# Patient Record
Sex: Male | Born: 1955 | Race: White | Hispanic: No | Marital: Married | Smoking: Current every day smoker
Health system: Southern US, Community
[De-identification: ages and names within clinical notes are randomized; demographics above are authoritative.]

## PROBLEM LIST (undated history)

## (undated) DIAGNOSIS — I10 Essential (primary) hypertension: Secondary | ICD-10-CM

## (undated) DIAGNOSIS — E079 Disorder of thyroid, unspecified: Secondary | ICD-10-CM

## (undated) HISTORY — DX: Disorder of thyroid, unspecified: E07.9

## (undated) HISTORY — DX: Essential (primary) hypertension: I10

---

## 1999-05-15 ENCOUNTER — Emergency Department (HOSPITAL_COMMUNITY): Admission: EM | Admit: 1999-05-15 | Discharge: 1999-05-15 | Payer: Self-pay | Admitting: Internal Medicine

## 2007-10-11 ENCOUNTER — Inpatient Hospital Stay (HOSPITAL_COMMUNITY): Admission: EM | Admit: 2007-10-11 | Discharge: 2007-10-11 | Payer: Self-pay | Admitting: Emergency Medicine

## 2007-10-11 ENCOUNTER — Ambulatory Visit: Payer: Self-pay | Admitting: Cardiovascular Disease

## 2007-10-11 ENCOUNTER — Encounter: Payer: Self-pay | Admitting: Internal Medicine

## 2008-03-26 ENCOUNTER — Ambulatory Visit: Payer: Self-pay | Admitting: Cardiology

## 2008-03-26 ENCOUNTER — Ambulatory Visit: Payer: Self-pay

## 2008-03-26 LAB — CONVERTED CEMR LAB: T3, Free: 18.2 pg/mL — ABNORMAL HIGH (ref 2.3–4.2)

## 2008-03-31 ENCOUNTER — Encounter: Payer: Self-pay | Admitting: Cardiology

## 2008-03-31 ENCOUNTER — Ambulatory Visit: Payer: Self-pay

## 2008-03-31 ENCOUNTER — Ambulatory Visit: Payer: Self-pay | Admitting: Cardiology

## 2008-04-08 ENCOUNTER — Ambulatory Visit: Payer: Self-pay | Admitting: Cardiology

## 2008-04-08 LAB — CONVERTED CEMR LAB: Prothrombin Time: 104.7 s (ref 10.9–13.3)

## 2008-04-09 ENCOUNTER — Encounter: Payer: Self-pay | Admitting: Endocrinology

## 2008-04-09 ENCOUNTER — Ambulatory Visit: Payer: Self-pay | Admitting: Cardiology

## 2008-04-09 DIAGNOSIS — E0590 Thyrotoxicosis, unspecified without thyrotoxic crisis or storm: Secondary | ICD-10-CM | POA: Insufficient documentation

## 2008-04-13 ENCOUNTER — Ambulatory Visit: Payer: Self-pay

## 2008-04-20 ENCOUNTER — Ambulatory Visit: Payer: Self-pay | Admitting: Cardiology

## 2008-04-20 LAB — CONVERTED CEMR LAB
INR: 1.3 — ABNORMAL HIGH (ref 0.8–1.0)
Prothrombin Time: 15.3 s — ABNORMAL HIGH (ref 10.9–13.3)

## 2008-04-27 ENCOUNTER — Ambulatory Visit: Payer: Self-pay | Admitting: Cardiology

## 2008-04-29 ENCOUNTER — Encounter (HOSPITAL_COMMUNITY): Admission: RE | Admit: 2008-04-29 | Discharge: 2008-07-14 | Payer: Self-pay | Admitting: Endocrinology

## 2008-04-30 ENCOUNTER — Encounter: Payer: Self-pay | Admitting: Endocrinology

## 2008-05-04 ENCOUNTER — Ambulatory Visit: Payer: Self-pay | Admitting: Cardiovascular Disease

## 2008-05-11 ENCOUNTER — Ambulatory Visit: Payer: Self-pay | Admitting: Cardiology

## 2008-05-11 LAB — CONVERTED CEMR LAB: Prothrombin Time: 24.1 s — ABNORMAL HIGH (ref 10.9–13.3)

## 2008-05-28 ENCOUNTER — Ambulatory Visit: Payer: Self-pay | Admitting: Cardiology

## 2008-06-14 ENCOUNTER — Ambulatory Visit: Payer: Self-pay | Admitting: Cardiology

## 2008-06-14 LAB — CONVERTED CEMR LAB
INR: 3.3 — ABNORMAL HIGH (ref 0.8–1.0)
Prothrombin Time: 33.8 s — ABNORMAL HIGH (ref 10.9–13.3)

## 2008-07-02 ENCOUNTER — Ambulatory Visit: Payer: Self-pay | Admitting: Internal Medicine

## 2008-07-02 LAB — CONVERTED CEMR LAB
INR: 1.5 — ABNORMAL HIGH (ref 0.8–1.0)
Prothrombin Time: 16.7 s — ABNORMAL HIGH (ref 10.9–13.3)

## 2008-07-21 ENCOUNTER — Ambulatory Visit: Payer: Self-pay | Admitting: Cardiology

## 2008-07-21 LAB — CONVERTED CEMR LAB
INR: 1.3 — ABNORMAL HIGH (ref 0.8–1.0)
Prothrombin Time: 14.9 s — ABNORMAL HIGH (ref 10.9–13.3)

## 2008-07-30 ENCOUNTER — Ambulatory Visit: Payer: Self-pay | Admitting: Cardiology

## 2008-07-30 LAB — CONVERTED CEMR LAB
INR: 1.3 — ABNORMAL HIGH (ref 0.8–1.0)
Prothrombin Time: 15.5 s — ABNORMAL HIGH (ref 10.9–13.3)

## 2008-08-10 ENCOUNTER — Ambulatory Visit: Payer: Self-pay

## 2008-08-27 ENCOUNTER — Ambulatory Visit: Payer: Self-pay | Admitting: Cardiology

## 2009-04-03 IMAGING — NM NM RAI THERAPY FOR HYPERTHYROIDISM
1 series · 1 of 1 positions shown · non-contrast
Comparison: Uptake and scan study done the same date.

CLINICAL DATA: Hyperthyroidism.

NUCLEAR MEDICINE RADIOACTIVE IODINE THERAPY FOR HYPERTHYROIDISM
TECHNIQUE: The risks and benefits of radioactive iodine therapy
were discussed with the patient in detail. Alternative therapies
were also mentioned. Radiation safety was discussed with the
patient, including how to protect the general public from exposure.
There were no barriers to communication.  Written consent was
obtained.  The patient then received a capsule containing the
radiopharmaceutical.  The patient will follow-up with the referring
physician.
Radiopharmaceutical: 30.5 mCi iodine 131 orally.

[Series 1: st static image · 1.61mm/px · 1 of 1 slices shown]
[im 1/1]
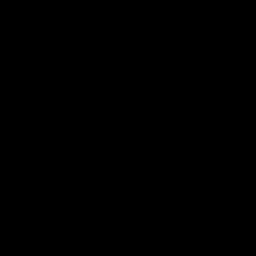

[1 of 1 positions shown; findings below may reference images not displayed]

IMPRESSION: Radioactive iodine therapy for hyperthyroidism as described.

## 2010-12-03 ENCOUNTER — Encounter: Payer: Self-pay | Admitting: Endocrinology

## 2011-03-27 NOTE — Discharge Summary (Signed)
NAME:  Chad Barron, Chad Barron              ACCOUNT NO.:  [id number]   MEDICAL RECORD NO.:  [id number]          PATIENT TYPE:  INP   LOCATION:  3735                         FACILITY:  [facility]   PHYSICIAN:  Madolyn Frieze. Jens Som, MD, FACCDATE OF BIRTH:  06-15-1956   DATE OF ADMISSION:  10/10/2007  DATE OF DISCHARGE:  10/11/2007                               DISCHARGE SUMMARY   PROCEDURES:  None.   PRIMARY FINAL DISCHARGE DIAGNOSIS:  Paroxysmal atrial fibrillation with  rapid ventricular response.   SECONDARY DIAGNOSES:  1. Hypertension.  2. Ongoing tobacco use.  3. History of alcohol use.  4. Family history of coronary artery disease.  5. Chest pain, felt secondary to atrial fibrillation.   TIME OF DISCHARGE:  Thirty-three minutes.   HOSPITAL COURSE:  Chad Barron is a 55 year old male with a history of  hypertension but no history of coronary artery disease.  He had onset of  palpitations that were associated with chest pain.  He came to the  emergency room,  where he was in atrial fibrillation with rapid  ventricular response and a heart rate of 154.  He was admitted for  further evaluation.   His cardiac enzymes were negative for MI.  A total cholesterol was 135,  triglycerides 214, HDL 22, LDL 70.  TSH was actually low at 0.011.  An  attempt will be made to add a free T3 and T4 to his labs.  A urine drug  screen was negative.   Chad Barron had a chest x-ray that showed mild peribronchial thickening  but no pneumonia.  He spontaneously converted to sinus rhythm and the  Cardizem was changed from IV to p.o.   He had come in shortly after midnight on October 11, 2007.  At  approximately noon on October 11, 2007, he was evaluated by Dr.  Jens Som, who felt that with his symptoms resolved he could be safely  discharged on aspirin and no Coumadin since his [place] score is 1 and  Cardizem.  He had not been taking lisinopril prior to admission because  it made him nauseated, and this and the  fluid pill he was taking are  currently on hold.  He will be followed in the office with an  echocardiogram, a stress Myoview and a followup with Dr. Jens Som.  Mr.  Barron was considered stable for discharge on October 11, 2007.   DISCHARGE INSTRUCTIONS:  His activity level is to be increased  gradually.  He is to stick to a low-sodium heart-healthy diet.  He is to  follow up in our office and we will call him for appointments.  He is to  follow up with primary care on [address] [facility] as needed.   DISCHARGE MEDICATIONS:  1. Cardizem CD 180 mg daily.  2. Aspirin 325 mg daily.   Lisinopril and previous diuretic are on hold.   He is encouraged not to smoke and to limit alcohol consumption.      Theodore Demark, PA-C      Madolyn Frieze. Jens Som, MD, [facility]  Electronically Signed    RB/MEDQ  D:  10/11/2007  T:  10/11/2007  Job:  [id number]   cc:   Derenda Mis

## 2011-03-27 NOTE — Assessment & Plan Note (Signed)
[facility]                            CARDIOLOGY OFFICE NOTE   ROBERTA, KELLY                     MRN:          [id number]  DATE:05/28/2008                            DOB:          [birth year]    Mr. Chad Barron is a 55 year old gentleman who I have seen for atrial  fibrillation.  When I last saw him on Mar 26, 2008, we scheduled him to  have an echocardiogram which was performed on Mar 31, 2008.  His LV  function was normal and the aortic valve was mildly calcified.  A repeat  thyroid panel revealed a TSH that was low at 0.02 and a free T3 that was  elevated at 18.2 with a free T4 of 4.6.  The patient was referred to Dr.  Talmage Barron, and he apparently has undergone radioactive treatment of his  thyroid successfully and he feels better at this point.  He denies any  dyspnea, chest pain, palpitations, or syncope.  There is no pedal edema.  His jittery/anxious feeling has improved.  He has now gained 5 pounds in  the past month, whereas he had lost 60-70 pounds in the preceding 6  months.   PRESENT MEDICATIONS:  Lopressor 50 mg p.o. b.i.d. and Coumadin as  directed.   PHYSICAL EXAMINATION:  VITAL SIGNS:  Blood pressure of 150/80 and pulse  is 80.  He weighs 223 pounds.  HEENT:  Normal.  NECK:  Supple.  CHEST:  Clear.  CARDIOVASCULAR:  Regular rate and rhythm.  ABDOMEN:  No tenderness.  EXTREMITIES:  No edema.   DIAGNOSES:  1. Atrial fibrillation - Mr. Ransome is in sinus rhythm on examination      today.  I think his atrial fibrillation was most likely caused by      his hyperthyroidism.  This is now being treated by Dr. Talmage Barron.  For      now, I will continue his Lopressor 50 mg p.o. b.i.d. and he will      continue on Coumadin with a goal INR of 2-3.  I will see him back      in 3 months.  If his thyroid has been fully treated, then we will      most likely discontinue his Coumadin at that point and resume his      aspirin.  Note, his LV function  is normal and his CHADS2 score is 1      for hypertension.  We are anticoagulating predominantly because the      hyperthyroidism is the cause of his atrial fibrillation.  Note, I      would also like to proceed with a Myoview after his thyroid is      completely treated.  2. Hypothyroidism - management per Dr. Talmage Barron.  3. Tobacco abuse - I discussed the importance of discontinuing this.  4. Hypertension - blood pressure is adequately controlled.      Chad Barron Chad Som, MD, [facility]  Electronically Signed   BSC/MedQ  DD: 05/28/2008  DT: 05/29/2008  Job #: [id number]

## 2011-03-27 NOTE — H&P (Signed)
NAME:  Chad Barron, Chad Barron NO.:  [id number]   MEDICAL RECORD NO.:  [id number]          PATIENT TYPE:  EMS   LOCATION:  MAJO                         FACILITY:  [facility]   PHYSICIAN:  Christell Faith, MD   DATE OF BIRTH:  20-Jul-1956   DATE OF ADMISSION:  10/10/2007  DATE OF DISCHARGE:                              HISTORY & PHYSICAL   PRIMARY CARE PHYSICIAN:  [facility] on [address].   CHIEF COMPLAINT:  Racing heart.   HISTORY OF PRESENT ILLNESS:  This is a 55 year old white man with no  prior cardiac history who was working in his yard this afternoon and  felt his heart suddenly start racing and beating irregularly.  This  lasted all day and so he presented to the emergency department.  This  has occurred before, but not for this duration.  In the past few months,  he has had several episodes of fast, irregular heart beat, but only  lasting minutes at a time.  He denies chest pain, syncope, presyncope or  shortness of breath.  He has no history of stroke or TIA or congestive  heart failure.   PAST MEDICAL HISTORY:  1. Hypertension.  2. History of a deep venous thrombosis approximately 20 years ago.  He      took Coumadin for 6 months.  This was in the left leg.  3. History of tonsillectomy.   ALLERGIES:  NONE.   MEDICINES:  Lisinopril and an unknown fluid pill.  Also one-a-day men's  vitamin.   SOCIAL HISTORY:  He lives in [place] with his wife and step-daughter.  He smokes 1-1/2 packs of cigarettes a day.  Drinks alcohol daily.  He is  a heavy caffeine user.  He drives a truck.  Adamantly denies cocaine,  amphetamine or pep-pill use.   FAMILY HISTORY:  Father is alive and had CABG at age 72.  Mother is  alive and has a pacemaker.   REVIEW OF SYSTEMS:  Positive for palpitations, weakness and arthralgias.  Otherwise, the balance of 14 systems is reviewed and is negative.   PHYSICAL EXAMINATION:  VITAL SIGNS:  Temperature 97.5.  Pulse 146.  Respiratory  rate 14.  Initial blood pressure 161/84.  Saturation 96% on  room air.  IN GENERAL:  This is a pleasant, healthy-looking white man in no acute  distress.  HEENT:  Pupils are equal, round and reactive to light.  Sclerae are  clear.  NECK:  Supple.  Neck veins are flat.  No carotid bruits.  No  thyromegaly.  No cervical lymphadenopathy.  CARDIAC EXAM:  Tachycardic rate.  Irregular rhythm.  No murmurs or  gallops.  Radial pulses are 2+ bilaterally.  Dorsalis pedis pulses 2+  bilaterally.  LUNGS:  Clear to auscultation bilaterally without wheezing or rales.  ABDOMEN:  Soft, nontender, nondistended with normal bowel sounds.  EXTREMITIES:  Reveal no clubbing, cyanosis or edema.  No lesions or  petechiae.  MUSCULOSKELETAL EXAM:  No joint effusions or joint deformities.  NEUROLOGIC EXAM:  Awake, alert and oriented x3.  There is 5/5 strength  in al 4 extremities.  DIAGNOSTIC TEST:  Chest x-ray reveals mild peribronchial thickening, but  no acute disease.  Electrocardiogram reveals atrial fibrillation with a  rate of 154 beats per minute.   LABORATORY DATA:  White blood cell 9.8, hemoglobin 15.3, platelets 247.  Sodium 137, potassium 3.5, BUN 8, creatinine 0.6, glucose 105.  CK-MB  1.2, troponin-I less than 0.05.   IMPRESSION:  A 55 year old white man with new onset atrial fibrillation  with rapid ventricular response.   PLAN:  1. Admit to telemetry.  2. Once rate controlled, convert to p.o. Diltiazem (the patient is      currently on Diltiazem drip in the emergency department).  3. Initiate heparin drip in anticipation of possible direct current      cardioversion.  4. Keep the patient n.p.o. after midnight for possible cardioversion.  5. Check TSH.  6. Check transthoracic echocardiogram.  7. Check D-dimer given history of deep venous thrombosis.  8. He will need tobacco cessation counseling.  9. We will counsel the patient to cut back on alcohol and caffeine      use.       Christell Faith, MD  Electronically Signed     NDL/MEDQ  D:  10/11/2007  T:  10/11/2007  Job:  [id number]

## 2011-03-27 NOTE — Assessment & Plan Note (Signed)
[facility]                            CARDIOLOGY OFFICE NOTE   Chad, Barron                     MRN:          [id number]  DATE:03/26/2008                            DOB:          [birth year]    Chad Barron is a 55 year old gentleman who returns for followup today.  Cardiac history dates back to October 10, 2007.  He had the sudden  onset of palpitations and was admitted to [facility] with  atrial fibrillation with rapid ventricle response.  He ruled out for  myocardial infarction with serial enzymes.  He converted spontaneously  to sinus rhythm and was placed on p.o. Cardizem.  At that time his Chads-  2 score was felt to be 1 for hypertension.  He was, therefore,  discharged on aspirin and Cardizem.  Note, apparently at the time of his  discharge his TSH was low at 0.11.  Followup T3 apparently was elevated  to 15.2 and T4 at 4.96.  Note the patient apparently did not have an  echocardiogram when he was in the hospital.  He was discharged on  aspirin and Cardizem 180 mg p.o. daily and was apparently lost to  followup until now.  Since that time he has had no dyspnea on exertion,  orthopnea, PND, pedal edema, presyncope, syncope, or chest pain.  He  denies any heat intolerance.  He has felt somewhat jittery  and  anxious.  He has lost approximately 60 pounds over the past 5 months.  He occasionally feels his heart skip.   MEDICATIONS:  His present medications include:  1. Cardizem at 180 mg daily.  2. Aspirin 325 mg daily.   PHYSICAL EXAMINATION:  VITAL SIGNS:  Blood pressure of 150/86, his pulse  is 74.  Weighs 227 pounds.  HEENT:  Normal.  NECK:  Supple.  I cannot appreciate thyromegaly.  CHEST:  Clear.  CARDIOVASCULAR:  Irregular rhythm.  Cannot appreciate murmurs, rubs or  gallops.  ABDOMEN:  Bowel exam shows no tenderness.  EXTREMITIES:  No edema.  The patient is somewhat tremulous on  examination.   His  electrocardiogram shows atrial fibrillation at a rate of 116.  There  are no significant ST changes.   DIAGNOSES:  1. Atrial fibrillation.  Chad Barron is back in atrial fibrillation      today.  However, he is relatively asymptomatic with no dyspnea or      chest pain.  It appears that his TSH was low and his T3-T4 elevated      at the time of his evaluation previously.  Therefore,      hyperthyroidism could certainly be causing his atrial fibrillation.      We will discontinue his Cardizem and instead control his heart rate      with Lopressor 50 mg p.o. b.i.d.  We can increase this as needed.      Also I am concerned that his risk of an embolic event is higher      since the etiology of his atrial fibrillation may be      hyperthyroidism.  To that end we  will discontinue his aspirin and      begin Coumadin with a goal INR of 2 to 3.  He will follow-up in our      Coumadin Clinic on Wednesday of next week for an INR evaluation.  I      have explained the importance of compliance with this and he is      agreeable.  We will also schedule him to have an echocardiogram to      assess his left ventricular function.  We will also refer him to      have his hyperthyroidism treated by Dr. Everardo All.  They will contact      him and this will be early next week that he will have that      evaluation.  Once his hyperthyroidism is treated and we may be able      to discontinue his Coumadin since that may be causing his atrial      fibrillation.  Also he will also need a Myoview for risk      stratification as he does have a family history of coronary      disease.  2. Hyperthyroidism.  I did speak with Dr. Jonny Ruiz who stated that the      primary care's office will contact Chad Barron and arrange for his      evaluation by Dr. Everardo All early next week.  3. Tobacco abuse.  We discussed importance of discontinuing this.  4. Hypertension.  We are adjusting Chad Barron blood pressure regimen      as  indicated.  I will change or add medications as indicated.  5.      History of alcohol use.  He has been decreased this from 12 liters      per day to approximately 4 to 5 beers per week.   Note we are also planning to repeat his TSH, T3 and T4 today prior to  his evaluation with Dr. Everardo All.  I will arrange for him to come back in  see [place] in approximately 4 weeks.  Note Chad Barron was very concerned  about the number of visits back to the office and also  financial issues  associated with that.  I explained that this is a potentially life  threatening disease and the risk of stroke if he does not take Coumadin.  He is agreeable to this.  Hopefully, this will be only be a temporary  thing once his hyperthyroidism is treated and his atrial fibrillation  may resolve.     Madolyn Frieze Jens Som, MD, [facility]  Electronically Signed    BSC/MedQ  DD: 03/26/2008  DT: 03/26/2008  Job #: [id number]   cc:   Gregary Signs A. Everardo All, MD

## 2011-03-27 NOTE — Assessment & Plan Note (Signed)
[facility]                            CARDIOLOGY OFFICE NOTE   Chad Barron, Chad Barron                     MRN:          [id number]  DATE:08/27/2008                            DOB:          May 03, 1956    Chad Barron is a pleasant gentleman who I have followed for atrial  fibrillation.  Note, an echocardiogram in May showed normal LV function.  His atrial fibrillation is felt secondary to hyperthyroidism.  Since I  last saw him, he has undergone radioactive treatment of his thyroid  successfully and he continues to improve.  This is being managed by Dr.  Talmage Barron.  Note, he is having some congestion and cough, but he has not had  chest pain, dyspnea, or palpitations.  There is no pedal edema or  syncope.   MEDICATIONS:  Include.  1. Levofloxacin 100 mcg p.o. daily.  2. Lopressor 50 mg p.o. daily.  3. Coumadin as directed.   PHYSICAL EXAMINATION:  VITAL SIGNS:  Today, shows a blood pressure  132/80, and his pulse is 66.  He weighs 257 pounds.  HEENT:  Normal.  NECK:  Supple.  CHEST:  Clear.  CARDIOVASCULAR:  Regular rate.  ABDOMEN:  No tenderness.  EXTREMITIES:  No edema.   DIAGNOSTIC DATA:  His electrocardiogram shows a sinus rhythm at a rate  of 66.  The axis is normal.  There are no ST changes noted.   DIAGNOSES:  1. Atrial fibrillation - I think all of Chad Barron rhythm problems      are related to his overactive thyroid.  This is now been treated      and he remains in sinus rhythm today.  His symptoms of      hyperthyroidism have also resolved.  We will discontinue his      Coumadin and I have asked him to take aspirin 81 mg daily.  He is      also on Lopressor 50 mg p.o. daily and I do not think he will      require this any longer.  I have asked him to decrease this to 25      mg p.o. daily for 3 days and then discontinue.  Note, his previous      LV function has been normal.  2. Hyperthyroidism - this will be managed by Dr. Talmage Barron.  3.  Tobacco abuse - we discussed to discontinued this.  4. History of hypertension - his blood pressure is well-controlled at      present.  He will monitor this off of Lopressor.  We will add other      medications as indicated.   I do think Chad Barron needs a treadmill and I have instructed him to  call us when he would like to proceed (at present he states he does not  have insurance and would like to delay this for now  and I think this is reasonable).  We will not schedule him to come back,  but he will contact us for his treadmill.     Chad Barron Chad Som, MD, [facility]  Electronically Signed  BSC/MedQ  DD: 08/27/2008  DT: 08/28/2008  Job #: [id number]   cc:   Chad Barron, M.D.

## 2011-08-21 LAB — POCT CARDIAC MARKERS
CKMB, poc: 1.2
Myoglobin, poc: 115
Troponin i, poc: <0.05

## 2011-08-21 LAB — T3, FREE: T3, Free: 15.2 — ABNORMAL HIGH (ref 2.3–4.2)

## 2011-08-21 LAB — CBC
HCT: 44.3
MCHC: 34.5
MCV: 84.3
Platelets: 247
RBC: 5.26
RDW: 12.6

## 2011-08-21 LAB — D-DIMER, QUANTITATIVE: D-Dimer, Quant: 0.27

## 2011-08-21 LAB — BASIC METABOLIC PANEL
BUN: 8
GFR calc non Af Amer: >60
Glucose, Bld: 105 — ABNORMAL HIGH

## 2011-08-21 LAB — PROTIME-INR
INR: 1
Prothrombin Time: 13.6

## 2011-08-21 LAB — APTT: aPTT: 31

## 2011-08-21 LAB — DIFFERENTIAL
Basophils Absolute: 0
Basophils Relative: 0
Eosinophils Absolute: 0.3
Lymphocytes Relative: 43
Lymphs Abs: 4.2 — ABNORMAL HIGH
Neutro Abs: 4

## 2011-08-21 LAB — RAPID URINE DRUG SCREEN, HOSP PERFORMED: Amphetamines: NOT DETECTED

## 2011-08-21 LAB — LIPID PANEL: LDL Cholesterol: 70

## 2015-02-02 ENCOUNTER — Telehealth: Payer: Self-pay | Admitting: Family Medicine

## 2015-02-02 DIAGNOSIS — I10 Essential (primary) hypertension: Secondary | ICD-10-CM

## 2015-02-02 MED ORDER — LISINOPRIL 10 MG PO TABS: 10.0000 mg | ORAL_TABLET | Freq: Every day | ORAL | Status: DC

## 2015-02-02 NOTE — Telephone Encounter (Signed)
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  BP 153/90 today, was higher on Monday (checked several times)  He does have HTN and needs to start medication Will start on 10mg  of lisinopril  He will see me on 4/2 in clinic for a recheck and labs

## 2015-02-12 ENCOUNTER — Ambulatory Visit (INDEPENDENT_AMBULATORY_CARE_PROVIDER_SITE_OTHER): Payer: No Typology Code available for payment source | Admitting: Family Medicine

## 2015-02-12 VITALS — BP 164/110 | HR 84 | Temp 97.7°F | Resp 18 | Ht 73.0 in | Wt 298.0 lb

## 2015-02-12 DIAGNOSIS — I10 Essential (primary) hypertension: Secondary | ICD-10-CM

## 2015-02-12 DIAGNOSIS — Z13220 Encounter for screening for lipoid disorders: Secondary | ICD-10-CM | POA: Diagnosis not present

## 2015-02-12 DIAGNOSIS — R0982 Postnasal drip: Secondary | ICD-10-CM

## 2015-02-12 LAB — COMPREHENSIVE METABOLIC PANEL
ALBUMIN: 3.9 g/dL (ref 3.5–5.2)
ALT: 64 U/L — ABNORMAL HIGH (ref 0–53)
AST: 49 U/L — AB (ref 0–37)
Alkaline Phosphatase: 101 U/L (ref 39–117)
BILIRUBIN TOTAL: 0.6 mg/dL (ref 0.2–1.2)
BUN: 9 mg/dL (ref 6–23)
CALCIUM: 9.2 mg/dL (ref 8.4–10.5)
CHLORIDE: 103 meq/L (ref 96–112)
CO2: 24 meq/L (ref 19–32)
Creat: 0.71 mg/dL (ref 0.50–1.35)
Glucose, Bld: 100 mg/dL — ABNORMAL HIGH (ref 70–99)
Potassium: 4.6 mEq/L (ref 3.5–5.3)
Sodium: 138 mEq/L (ref 135–145)
TOTAL PROTEIN: 6.8 g/dL (ref 6.0–8.3)

## 2015-02-12 LAB — LIPID PANEL
CHOL/HDL RATIO: 6.5 ratio
Cholesterol: 203 mg/dL — ABNORMAL HIGH (ref 0–200)
HDL: 31 mg/dL — AB (ref >=40)
LDL CALC: 136 mg/dL — AB (ref 0–99)
TRIGLYCERIDES: 182 mg/dL — AB (ref <150)
VLDL: 36 mg/dL (ref 0–40)

## 2015-02-12 MED ORDER — LISINOPRIL 20 MG PO TABS: 20.0000 mg | ORAL_TABLET | Freq: Every day | ORAL | Status: DC

## 2015-02-12 MED ORDER — IPRATROPIUM BROMIDE 0.03 % NA SOLN: 2.0000 | Freq: Four times a day (QID) | NASAL | Status: DC

## 2015-02-12 NOTE — Progress Notes (Addendum)
[facility] [address], [place] [id number] [phone]  Date:  02/12/2015   Name:  Chad Barron   DOB:  [birth year]   MRN:  [id number]  PCP:  No primary care provider on file.    Chief Complaint: Follow-up   History of Present Illness:  Chad Barron is a 59 y.o. very pleasant male patient who presents with the following:  Here today to recheck his BP. 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  He would also like to have labs today  He has been taking some medication for a URI recently including decongestants.  He has been "sick with sinus sx"- he states he has "always been plagued with sinuses."  He notes PND- this can even lead to nausea.  He has some cough, no fever as far as he knows.  He does not have body aches, no chills. He does have runny eyes.    He has had just black coffee so far today- would like to have a cholesterol check  Patient Active Problem List   Diagnosis Date Noted  . HYPERTHYROIDISM 04/09/2008    Past Medical History  Diagnosis Date  . Hypertension   . Thyroid disease     Hyperthyroidism    History reviewed. No pertinent past surgical history.  History  Substance Use Topics  . Smoking status: Current Every Day Smoker  . Smokeless tobacco: Not on file  . Alcohol Use: 0.0 oz/week    0 Standard drinks or equivalent per week    History reviewed. No pertinent family history.  No Known Allergies  Medication list has been reviewed and updated.  Current Outpatient Prescriptions on File Prior to Visit  Medication Sig Dispense Refill  . lisinopril (PRINIVIL,ZESTRIL) 10 MG tablet Take 1 tablet (10 mg total) by mouth daily. 30 tablet 1   No current facility-administered medications on file prior to visit.    Review of Systems:  As per HPI- otherwise negative.   Physical Examination: Filed Vitals:   02/12/15 0828  BP: 164/110  Pulse: 84  Temp: 97.7 F (36.5 C)  Resp: 18    Filed Vitals:   02/12/15 0828  Height: 6\' 1"  (1.854 m)  Weight: 298 lb (135.172 kg)   Body mass index is 39.32 kg/(m^2). Ideal Body Weight: Weight in (lb) to have BMI = 25: 189.1  GEN: WDWN, NAD, Non-toxic, A & O x 3, obese/ large build.  Looks well HEENT: Atraumatic, Normocephalic. Neck supple. No masses, No LAD. Bilateral TM wnl.  PEERL,EOMI.   Erythema of his throat and nasal cavity Ears and Nose: No external deformity. CV: RRR, No M/G/R. No JVD. No thrill. No extra heart sounds. PULM: CTA B, no wheezes, crackles, rhonchi. No retractions. No resp. distress. No accessory muscle use. EXTR: No c/c/e NEURO Normal gait.  PSYCH: Normally interactive. Conversant. Not depressed or anxious appearing.  Calm demeanor.    Assessment and Plan: PND (post-nasal drip) - Plan: ipratropium (ATROVENT) 0.03 % nasal spray  Benign essential HTN - Plan: lisinopril (PRINIVIL,ZESTRIL) 20 MG tablet  Essential hypertension - Plan: Comprehensive metabolic panel  Screening for hyperlipidemia - Plan: Lipid panel  Will increase his BP medication to 20 mg of lisinopril (from 10).  Await labs.   Encouraged him to avoid decongestant medications as these may raise his BP Asked him to please update me as to his BP when he has been on his increased dose  for a week or so Discussed strategies for treating what is likely allergies- he will try atrovent nasal and claritin as needed   Signed Abbe Amsterdam, MD  4/3- received his labs, called and we went over results.  His LFTs are a little bit high and he does admit to drinking more alcohol that he would generally over recent months.  Also, his HDL is low.  Offered medication but he would prefer to work on diet and exercise, and we will recheck his labs in about 3 months. Encouraged him to cut down on alcohol as his liver is feeling the strain.   Results for orders placed or performed in visit on 02/12/15  Comprehensive metabolic panel  Result Value Ref Range    Sodium 138 135 - 145 mEq/L   Potassium 4.6 3.5 - 5.3 mEq/L   Chloride 103 96 - 112 mEq/L   CO2 24 19 - 32 mEq/L   Glucose, Bld 100 (H) 70 - 99 mg/dL   BUN 9 6 - 23 mg/dL   Creat 1.61 0.96 - 0.45 mg/dL   Total Bilirubin 0.6 0.2 - 1.2 mg/dL   Alkaline Phosphatase 101 39 - 117 U/L   AST 49 (H) 0 - 37 U/L   ALT 64 (H) 0 - 53 U/L   Total Protein 6.8 6.0 - 8.3 g/dL   Albumin 3.9 3.5 - 5.2 g/dL   Calcium 9.2 8.4 - 40.9 mg/dL  Lipid panel  Result Value Ref Range   Cholesterol 203 (H) 0 - 200 mg/dL   Triglycerides 811 (H) <150 mg/dL   HDL 31 (L) >=91 mg/dL   Total CHOL/HDL Ratio 6.5 Ratio   VLDL 36 0 - 40 mg/dL   LDL Cholesterol 478 (H) 0 - 99 mg/dL

## 2015-02-12 NOTE — Patient Instructions (Signed)
Good to see you today!   Be careful of decongestant products - they will tend to run your BP up.  Plain tylenol and mucinex are better for people with high BP Try that atrovent nasal spray for your post- nasal drainage, and you might also try some OTC claritin (loratadine) We are going to increase your lisinopril to 20 mg. Take 2 of the 10 mg tablets that you have now and then go to the 20 mg. When you have been on your new dose for about one week please ask your wife to check your BP for me, and let me know how it looks   Take care and let me know if you have any problems!  Otherwise I will follow-up with your pending your labs, and we can plan to recheck in clinic in 3-4 months.

## 2015-02-13 ENCOUNTER — Encounter: Payer: Self-pay | Admitting: Family Medicine

## 2015-07-26 ENCOUNTER — Ambulatory Visit (INDEPENDENT_AMBULATORY_CARE_PROVIDER_SITE_OTHER): Payer: No Typology Code available for payment source | Admitting: Internal Medicine

## 2015-07-26 VITALS — BP 182/94 | HR 78 | Temp 97.7°F | Resp 18 | Ht 74.0 in | Wt 297.0 lb

## 2015-07-26 DIAGNOSIS — R7989 Other specified abnormal findings of blood chemistry: Secondary | ICD-10-CM

## 2015-07-26 DIAGNOSIS — I10 Essential (primary) hypertension: Secondary | ICD-10-CM

## 2015-07-26 DIAGNOSIS — E785 Hyperlipidemia, unspecified: Secondary | ICD-10-CM | POA: Diagnosis not present

## 2015-07-26 DIAGNOSIS — Z6838 Body mass index (BMI) 38.0-38.9, adult: Secondary | ICD-10-CM | POA: Insufficient documentation

## 2015-07-26 DIAGNOSIS — N200 Calculus of kidney: Secondary | ICD-10-CM | POA: Diagnosis not present

## 2015-07-26 DIAGNOSIS — R945 Abnormal results of liver function studies: Secondary | ICD-10-CM | POA: Insufficient documentation

## 2015-07-26 DIAGNOSIS — R319 Hematuria, unspecified: Secondary | ICD-10-CM

## 2015-07-26 LAB — POCT URINALYSIS DIPSTICK
Glucose, UA: NEGATIVE
Ketones, UA: 15
LEUKOCYTES UA: NEGATIVE
NITRITE UA: NEGATIVE
PH UA: 6
Protein, UA: 100
Spec Grav, UA: >=1.03
UROBILINOGEN UA: 1

## 2015-07-26 LAB — POCT UA - MICROSCOPIC ONLY
Bacteria, U Microscopic: NEGATIVE
Casts, Ur, LPF, POC: NEGATIVE
Mucus, UA: POSITIVE
Yeast, UA: NEGATIVE

## 2015-07-26 MED ORDER — LISINOPRIL 40 MG PO TABS: 40.0000 mg | ORAL_TABLET | Freq: Every day | ORAL | Status: DC

## 2015-07-26 NOTE — Progress Notes (Addendum)
Subjective:  This chart was scribed for Ellamae Sia, MD by Stann Ore, Medical Scribe. This patient was seen in Room 12 and the patient's care was started at 5:55 PM.     Patient ID: Chad Barron, male    DOB: 01-28-1956, 59 y.o.   MRN: [id number] Chief Complaint  Patient presents with  . Flank Pain    possible kidney stones    HPI Chad Barron is a 59 y.o. male who has h/o kidney stones presents to [facility] complaining of right flank pain with associated hematuria starting 5 days ago. Actually sent up from occupational medicine exam with the finding of hematuria and the report of flank pain. He initially felt a straining pain in his right flank 1 week ago. He has had several kidney stones over many years and this was familiar. In the past, he's passed every stone he's ever had within 7 days. He mainly drinks water, tea, gatorade and very rarely a sprite so he's not sure why there's an onset this time. He also denies a lot of sodium in his diet. He has some dysuria but not severe. He also has appetite loss. He denies increased frequency, fever, chills, nausea.  He has been evaluated in the past by Dr. Aldean Ast and treated for several of the stones.  He is a [employer] and has not been able to manage his obesity by changing his diet or exercise habits. He tries to have a prudent stone prevention.Marland Kitchen  He was sent to occupational medicine for random drug screen today by his employer and his blood pressure systolic was over 200. He was noted to have missed refilling his lisinopril prescription a few weeks ago. He was sent up here out of concern for urgent hypertensive problems and hematuria.  He is not very concerned. His wife would occasionally check his blood pressure, and has been stable at home.  He has no associated symptoms with accelerated hypertension   Patient Active Problem List   Diagnosis Date Noted  . Essential hypertension 02/12/2015  . HYPERTHYROIDISM  04/09/2008    -  reactive airway disease with allergic rhinitis   he has endocrinology follow-up He has primary care follow-up here with Dr. Dallas Schimke  Current outpatient prescriptions:  .  ipratropium (ATROVENT) 0.03 % nasal spray, Place 2 sprays into the nose 4 (four) times daily., Disp: 30 mL, Rfl: 6 .  lisinopril (PRINIVIL,ZESTRIL) 20 MG tablet, Take 1 tablet (20 mg total) by mouth daily. (Patient not taking: Reported on 07/26/2015), Disp: 30 tablet, Rfl: 2  Review of Systems  Constitutional: Positive for appetite change. Negative for fever, chills, diaphoresis and fatigue.  Gastrointestinal: Negative for nausea.  Genitourinary: Positive for dysuria, hematuria and flank pain (right flank). Negative for frequency.  Neurological: Negative for dizziness and headaches.       Objective:   Physical Exam  Constitutional: He is oriented to person, place, and time. He appears well-developed and well-nourished. No distress.  He is comfortable in the exam room and not an acute pain  HENT:  Head: Normocephalic and atraumatic.  Eyes: EOM are normal. Pupils are equal, round, and reactive to light.  Neck: Neck supple.  Cardiovascular: Normal rate, regular rhythm and normal heart sounds.   Pulmonary/Chest: Effort normal. No respiratory distress.  Abdominal: He exhibits no distension and no mass. There is no tenderness. There is no rebound and no guarding.  Musculoskeletal: Normal range of motion. He exhibits no edema.  Minimal CVA tenderness to percussion  Neurological: He is alert and oriented to person, place, and time.  Skin: Skin is warm and dry.  Psychiatric: He has a normal mood and affect. His behavior is normal.  Nursing note and vitals reviewed.   BP 182/94 mmHg  Pulse 78  Temp(Src) 97.7 F (36.5 C)  Resp 18  Ht  (1.88 m)  Wt 297 lb (134.718 kg)  BMI 38.12 kg/m2  SpO2 98%     Results for orders placed or performed in visit on 07/26/15  POCT urinalysis dipstick    Result Value Ref Range   Color, UA brown    Clarity, UA cloudy    Glucose, UA neg    Bilirubin, UA moderate    Ketones, UA 15    Spec Grav, UA >=1.030    Blood, UA large    pH, UA 6.0    Protein, UA 100    Urobilinogen, UA 1.0    Nitrite, UA neg    Leukocytes, UA Negative Negative  POCT UA - Microscopic Only  Result Value Ref Range   WBC, Ur, HPF, POC 0-2    RBC, urine, microscopic TNTC    Bacteria, U Microscopic neg    Mucus, UA positive    Epithelial cells, urine per micros 0-3    Crystals, Ur, HPF, POC calcium oxalate    Casts, Ur, LPF, POC neg    Yeast, UA neg     Assessment & Plan:   I have completed the patient encounter in its entirety as documented by the scribe, with editing by me where necessary. Harrold Fitchett P. Merla Riches, M.D.  Hematuria - Plan: POCT urinalysis dipstick, POCT UA - Microscopic Only  Benign essential HTN -currently uncontrolled .  Plan: Restart lisinopril (PRINIVIL,ZESTRIL) 40 MG tablet instead of just 20  Daily blood pressures by wife for several days  BMI 38.0-38.9,adult  Discussed dietary changes "on the road"  Abnormal LFTs  Follow-up labs and exam in 6 weeks  Hyperlipidemia  Repeat labs and consider medicines in 6 weeks  Nephrolithiasis-recurrent  We'll send to urology if doesn't resolve this episode the next week  He doesn't will pain medication  He is not interested in Flomax  He will continue to push fluids  Meds ordered this encounter  Medications  . lisinopril (PRINIVIL,ZESTRIL) 40 MG tablet    Sig: Take 1 tablet (40 mg total) by mouth daily.    Dispense:  90 tablet    Refill:  0   He should return for his six-month follow-up as indicated by Dr. Dallas Schimke

## 2015-12-24 ENCOUNTER — Other Ambulatory Visit: Payer: Self-pay | Admitting: Internal Medicine

## 2016-05-19 ENCOUNTER — Other Ambulatory Visit: Payer: Self-pay | Admitting: Family Medicine
